# Patient Record
Sex: Male | Born: 1975 | Race: White | Hispanic: No | Marital: Single | Smoking: Current some day smoker
Health system: Southern US, Community
[De-identification: ages and names within clinical notes are randomized; demographics above are authoritative.]

---

## 2005-07-20 ENCOUNTER — Emergency Department: Payer: Self-pay | Admitting: Emergency Medicine

## 2007-05-06 ENCOUNTER — Ambulatory Visit: Payer: Self-pay

## 2008-01-30 IMAGING — CR RIGHT RING FINGER 2+V
1 series · 3 of 3 positions shown · non-contrast
Comparison: none

REASON FOR EXAM: swelling, injured
COMMENTS:

PROCEDURE:     DXR - DXR FINGER RING 4TH DIGIT RT CEEJAY  - May 06, 2007 [DATE]
RESULT:     Three views of the LEFT fourth finger show no fracture,
dislocation or other acute bony abnormality.

[Series 1: view not recorded · 0.17mm/px · 3 of 3 slices shown]
[im 1/3]
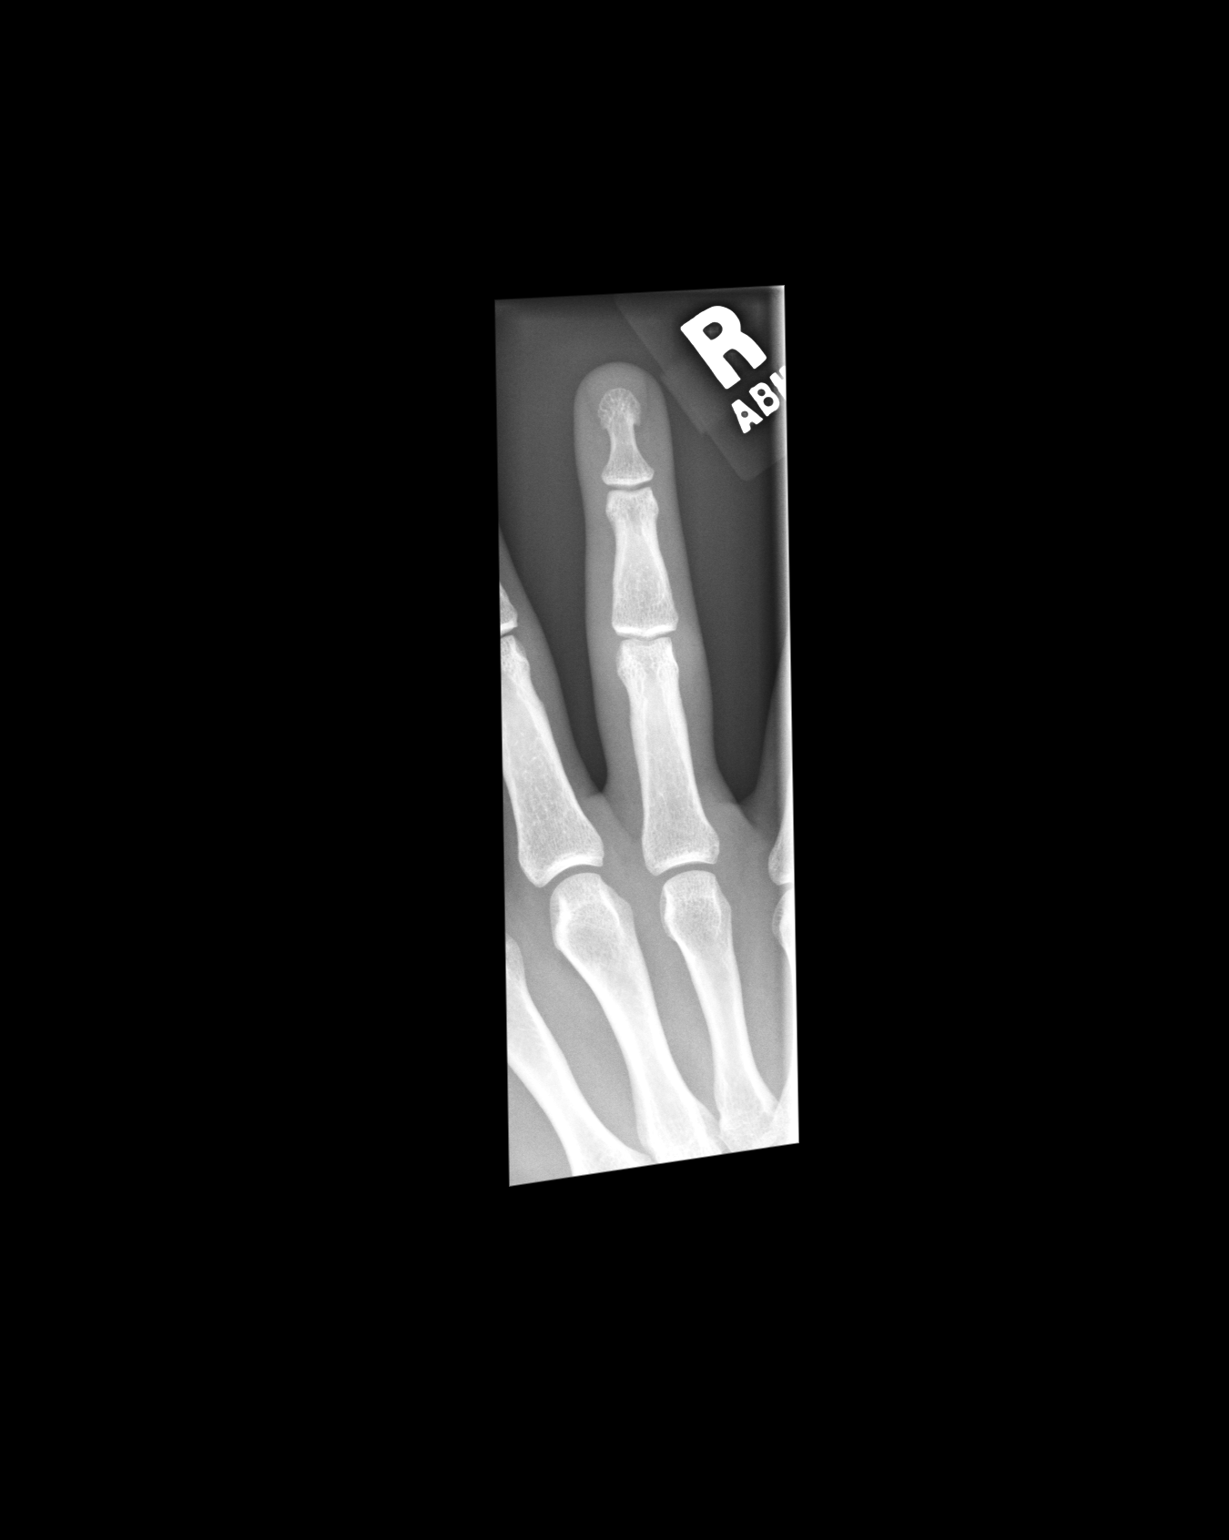
[im 2/3]
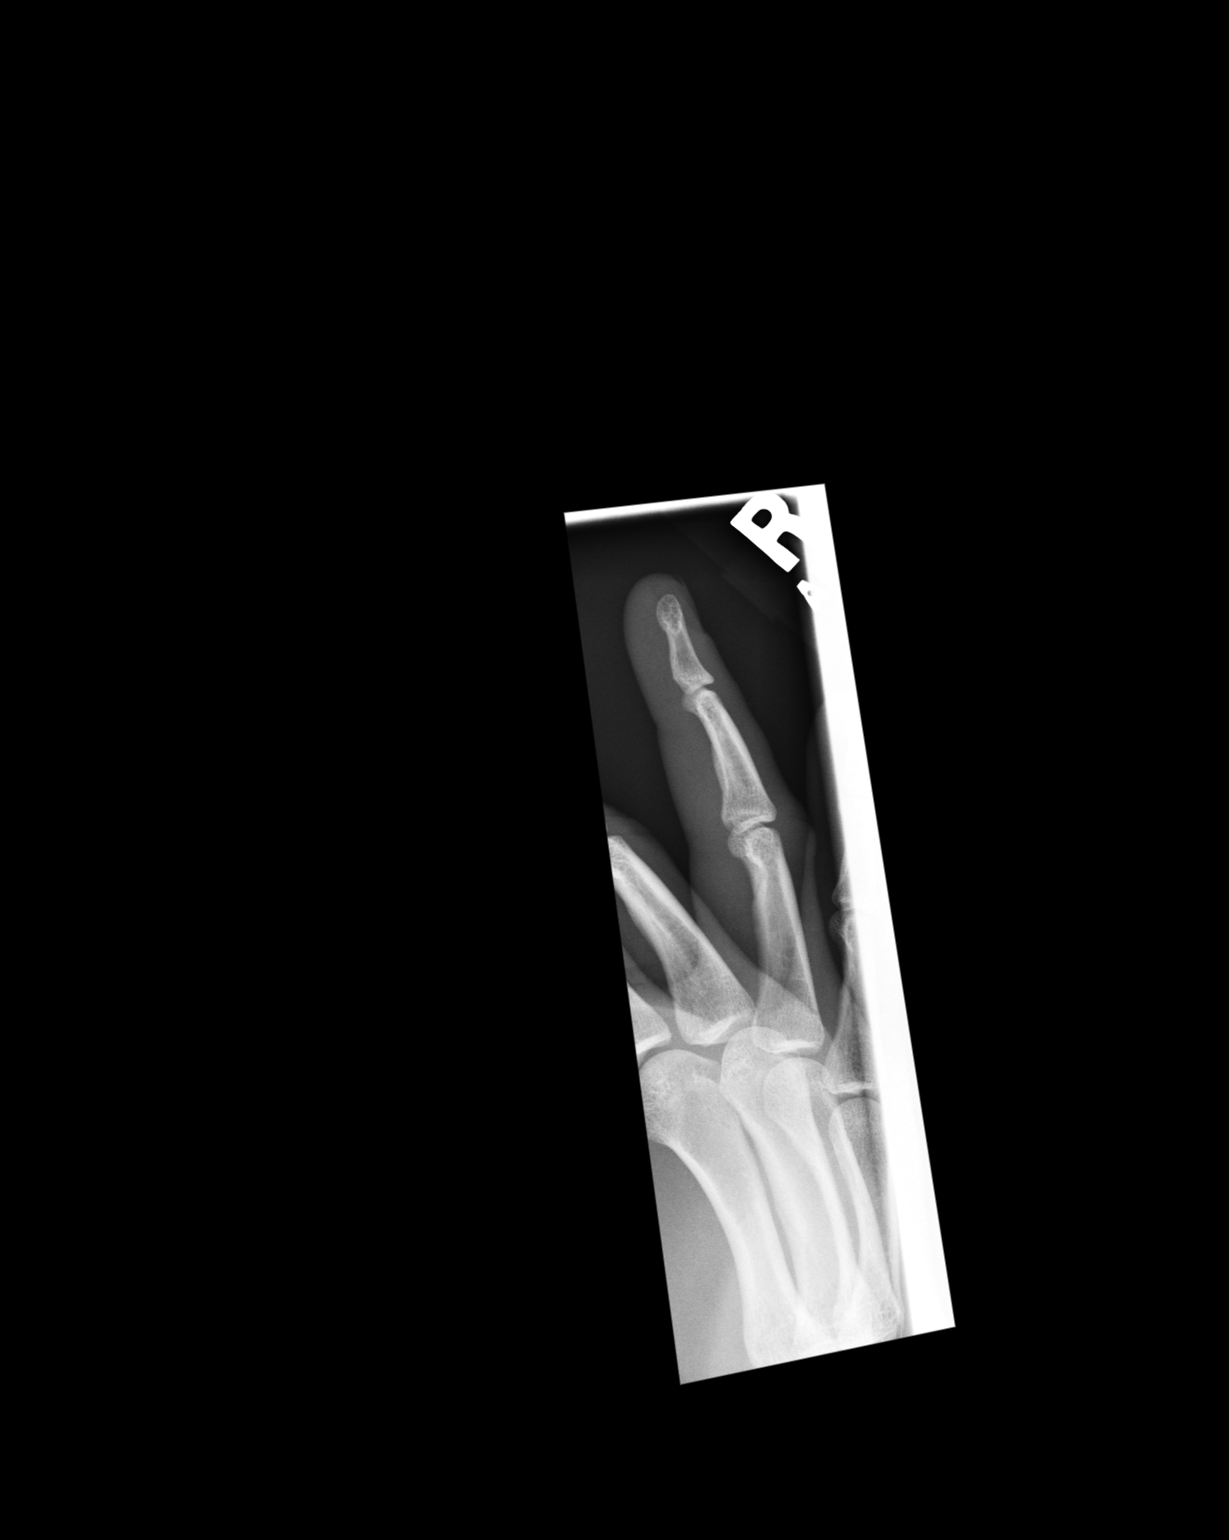
[im 3/3]
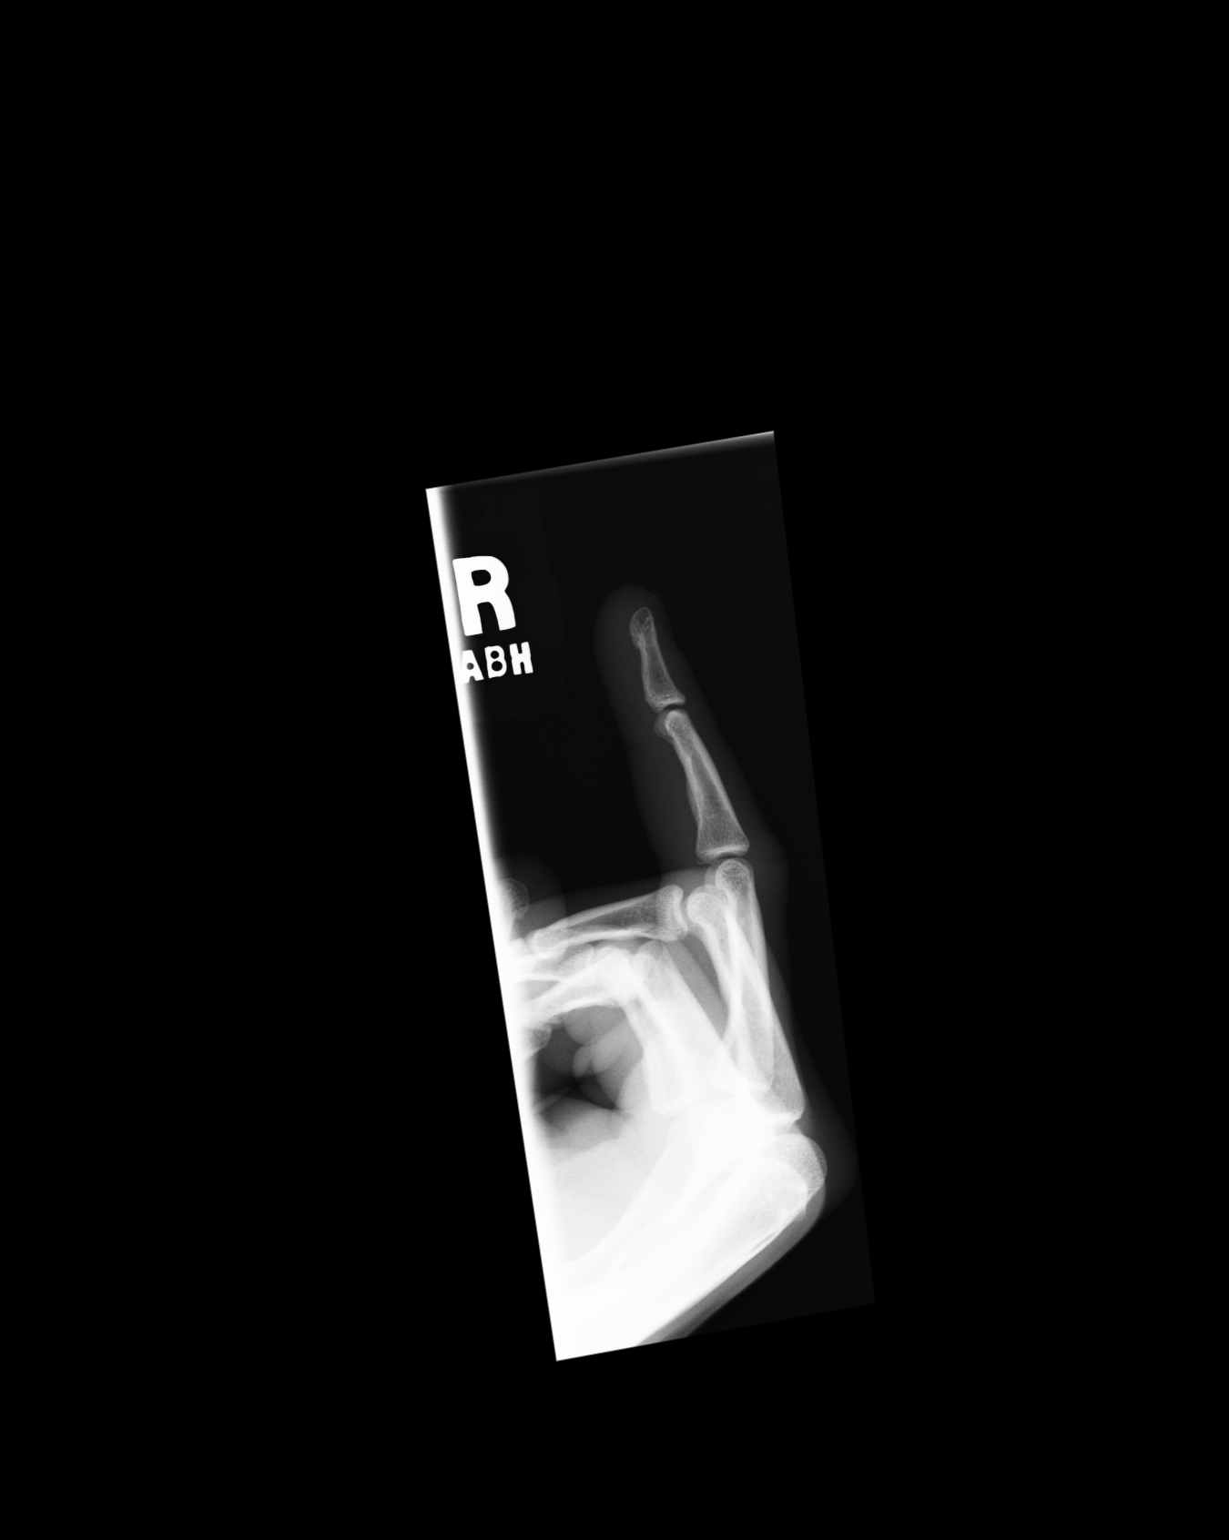

[3 of 3 positions shown; findings below may reference images not displayed]

IMPRESSION: 1.     No acute changes are identified.

## 2010-05-29 ENCOUNTER — Emergency Department: Payer: Self-pay | Admitting: Emergency Medicine

## 2012-09-17 ENCOUNTER — Emergency Department: Payer: Self-pay

## 2013-07-30 ENCOUNTER — Emergency Department: Payer: Self-pay | Admitting: Emergency Medicine

## 2013-11-17 ENCOUNTER — Emergency Department: Payer: Self-pay | Admitting: Emergency Medicine

## 2014-04-01 ENCOUNTER — Emergency Department: Payer: Self-pay | Admitting: Emergency Medicine

## 2016-07-16 ENCOUNTER — Emergency Department: Payer: Self-pay

## 2016-07-16 ENCOUNTER — Emergency Department
Admission: EM | Admit: 2016-07-16 | Discharge: 2016-07-16 | Disposition: A | Discharge status: Home / Self Care | Payer: Self-pay | Attending: Emergency Medicine | Admitting: Emergency Medicine

## 2016-07-16 ENCOUNTER — Encounter: Payer: Self-pay | Admitting: Emergency Medicine

## 2016-07-16 DIAGNOSIS — R0789 Other chest pain: Secondary | ICD-10-CM | POA: Insufficient documentation

## 2016-07-16 DIAGNOSIS — F17200 Nicotine dependence, unspecified, uncomplicated: Secondary | ICD-10-CM | POA: Insufficient documentation

## 2016-07-16 MED ORDER — CYCLOBENZAPRINE HCL 5 MG PO TABS: 5.0000 mg | ORAL_TABLET | Freq: Three times a day (TID) | ORAL | 0 refills | Status: DC | PRN

## 2016-07-16 NOTE — ED Triage Notes (Signed)
Pt presents with right shoulder/muscle pain.

## 2016-07-16 NOTE — Discharge Instructions (Signed)
Please seek medical attention for any high fevers, chest pain, shortness of breath, change in behavior, persistent vomiting, bloody stool or any other new or concerning symptoms.  

## 2016-07-16 NOTE — ED Notes (Signed)
Pt reports that he had a stomach virus yesterday and today he feels like he pulled something in his chest from dry heaving yesterday - he denies any nausea or vomiting today - coughing makes the pain worse

## 2016-07-16 NOTE — ED Provider Notes (Signed)
[facility] Emergency Department Provider Note    ____________________________________________   I have reviewed the triage vital signs and the nursing notes.   HISTORY  Chief Complaint Muscle Pain   History limited by: Not Limited   HPI Phillip Fisher is a 40 y.o. male who presents to the emergency department today because of concerns for right upper chest pain. He states that it started yesterday after he had been vomiting. He thinks he got a stomach bug. Thus he vomited and dry heaved a lot yesterday. The pain is sharp. It is worse with cough or movement. He denies any fevers although thinks he had some chills yesterday. He denies any further vomiting today.    History reviewed. No pertinent past medical history.  There are no active problems to display for this patient.   History reviewed. No pertinent surgical history.  Prior to Admission medications   Not on File    Allergies Review of patient's allergies indicates no known allergies.  No family history on file.  Social History Social History  Substance Use Topics  . Smoking status: Current Some Day Smoker  . Smokeless tobacco: Never Used  . Alcohol use Yes    Review of Systems  Constitutional: Negative for fever. Cardiovascular: Positive for right upper chest. Respiratory: Negative for shortness of breath. Gastrointestinal: Negative for abdominal pain, vomiting and diarrhea. Neurological: Negative for headaches, focal weakness or numbness.   10-point ROS otherwise negative.  ____________________________________________   PHYSICAL EXAM:  VITAL SIGNS: ED Triage Vitals  Enc Vitals Group     BP 07/16/16 1553 (!) 133/93     Pulse Rate 07/16/16 1553 (!) 109     Resp 07/16/16 1553 20     Temp 07/16/16 1553 98.4 F (36.9 C)     Temp Source 07/16/16 1553 Oral     SpO2 07/16/16 1553 98 %     Weight 07/16/16 1554 165 lb (74.8 kg)     Height 07/16/16 1554 5\' 8"  (1.727 m)      Head Circumference --      Peak Flow --      Pain Score 07/16/16 1554 9   Constitutional: Alert and oriented. Well appearing and in no distress. Eyes: Conjunctivae are normal. PERRL. Normal extraocular movements. ENT   Head: Normocephalic and atraumatic.   Nose: No congestion/rhinnorhea.   Mouth/Throat: Mucous membranes are moist.   Neck: No stridor. Hematological/Lymphatic/Immunilogical: No cervical lymphadenopathy. Cardiovascular: Normal rate, regular rhythm.  No murmurs, rubs, or gallops. Respiratory: Normal respiratory effort without tachypnea nor retractions. Breath sounds are clear and equal bilaterally. No wheezes/rales/rhonchi. Gastrointestinal: Soft and nontender. No distention.  Genitourinary: Deferred Musculoskeletal: Normal range of motion in all extremities. No joint effusions.  No lower extremity tenderness nor edema.Patient's right chest is tender to palpation Neurologic:  Normal speech and language. No gross focal neurologic deficits are appreciated.  Skin:  Skin is warm, dry and intact. No rash noted. Psychiatric: Mood and affect are normal. Speech and behavior are normal. Patient exhibits appropriate insight and judgment.  ____________________________________________    LABS (pertinent positives/negatives)  None  ____________________________________________   EKG  None  ____________________________________________    RADIOLOGY  CXR IMPRESSION:  No edema or consolidation. Scarring right apex.   ____________________________________________   PROCEDURES  Procedures  ____________________________________________   INITIAL IMPRESSION / ASSESSMENT AND PLAN / ED COURSE  Pertinent labs & imaging results that were available during my care of the patient were reviewed by me and considered in my  medical decision making (see chart for details).  Patient presented to the emergency department today because of concerns for right upper  chest pain. On exam patient is tender to palpation in the right upper chest. Chest x-ray without any concerning findings. Think likely patient is suffering from inflammation of the chest wall.  ____________________________________________   FINAL CLINICAL IMPRESSION(S) / ED DIAGNOSES  Final diagnoses:  Right-sided chest wall pain     Note: This dictation was prepared with Dragon dictation. Any transcriptional errors that result from this process are unintentional    Phineas SemenGraydon Kellis Mcadam, MD 07/16/16 1657

## 2017-04-11 IMAGING — CR DG CHEST 2V
2 series · 2 of 2 positions shown · non-contrast
Comparison: None.

CLINICAL DATA: Chest/right shoulder region pain

EXAM:
CHEST  2 VIEW

[chest pa]
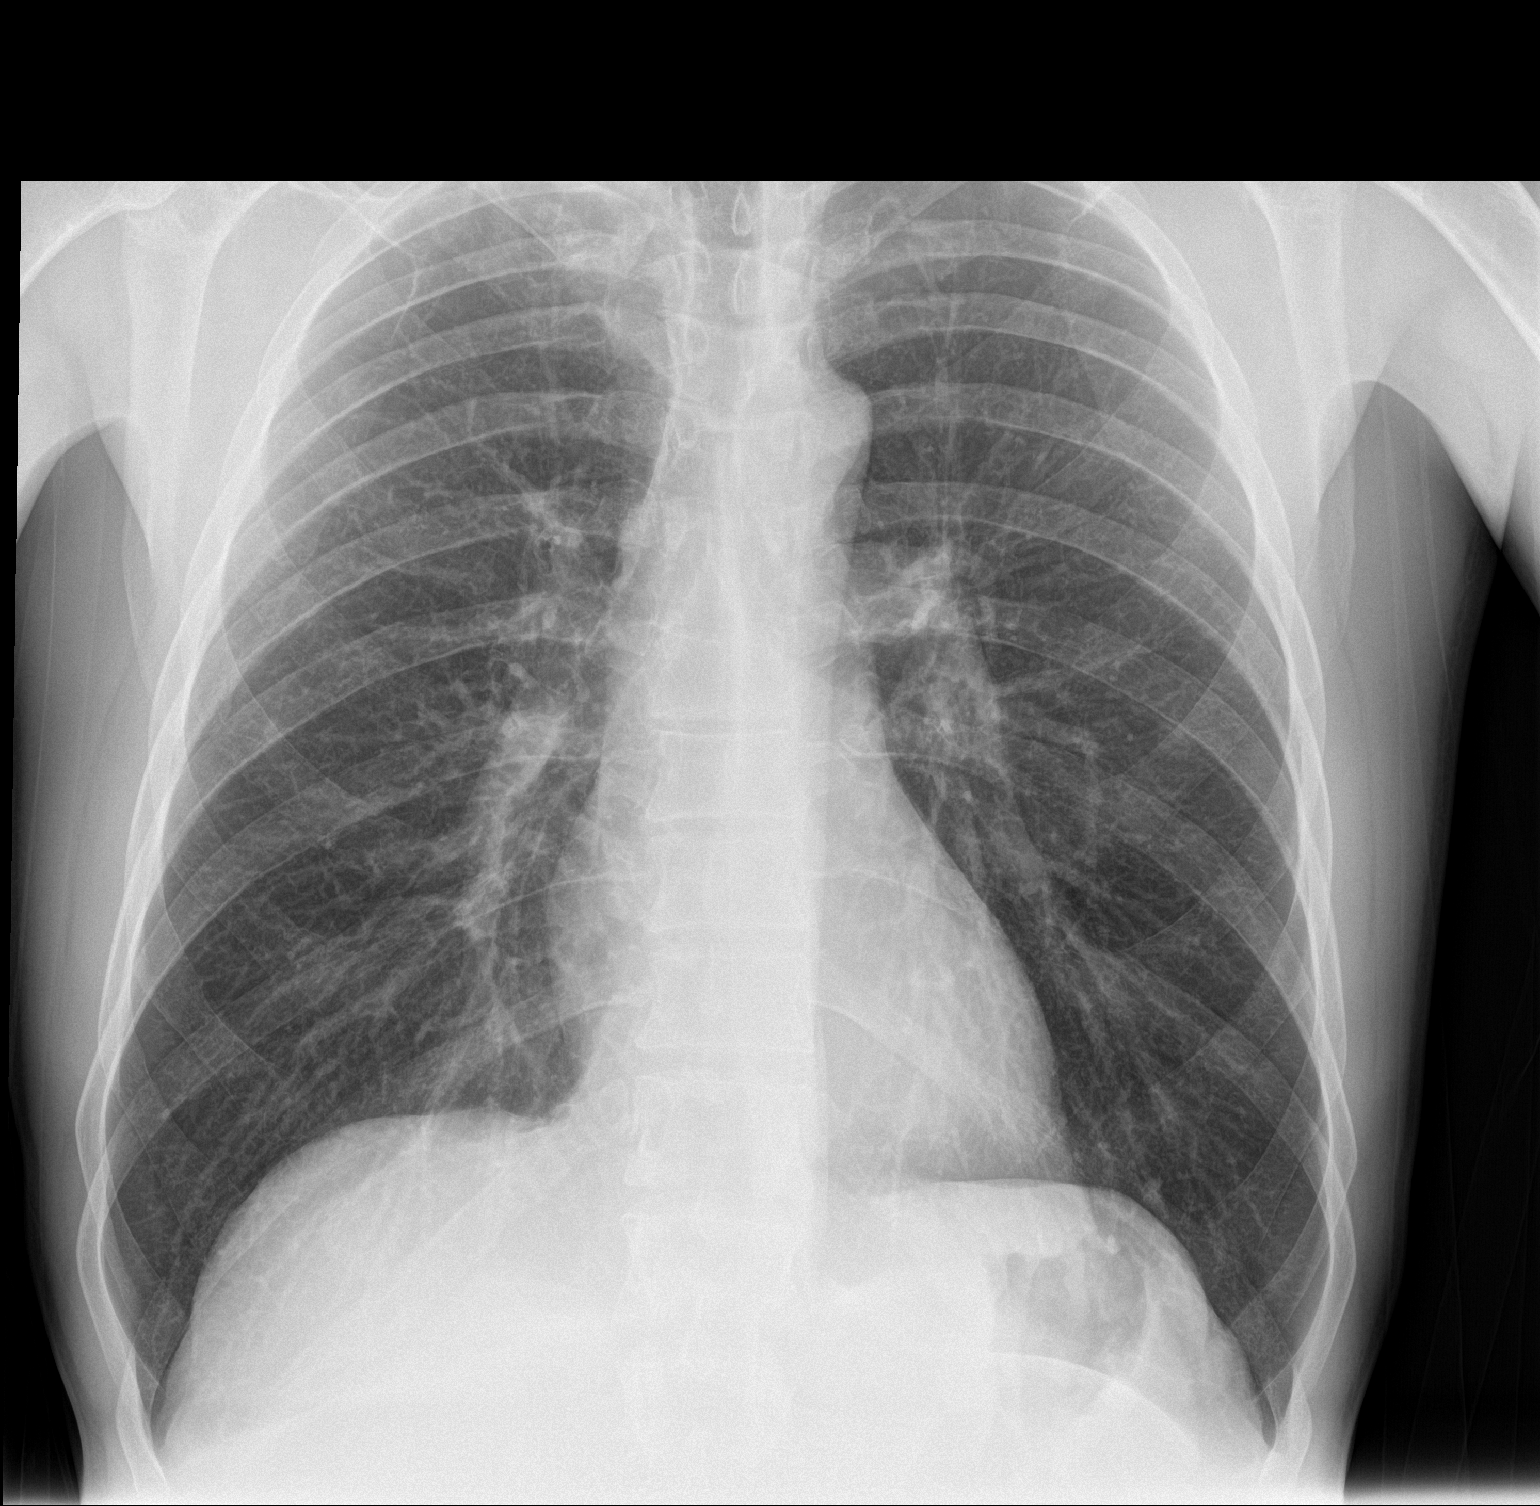

[chest lat]
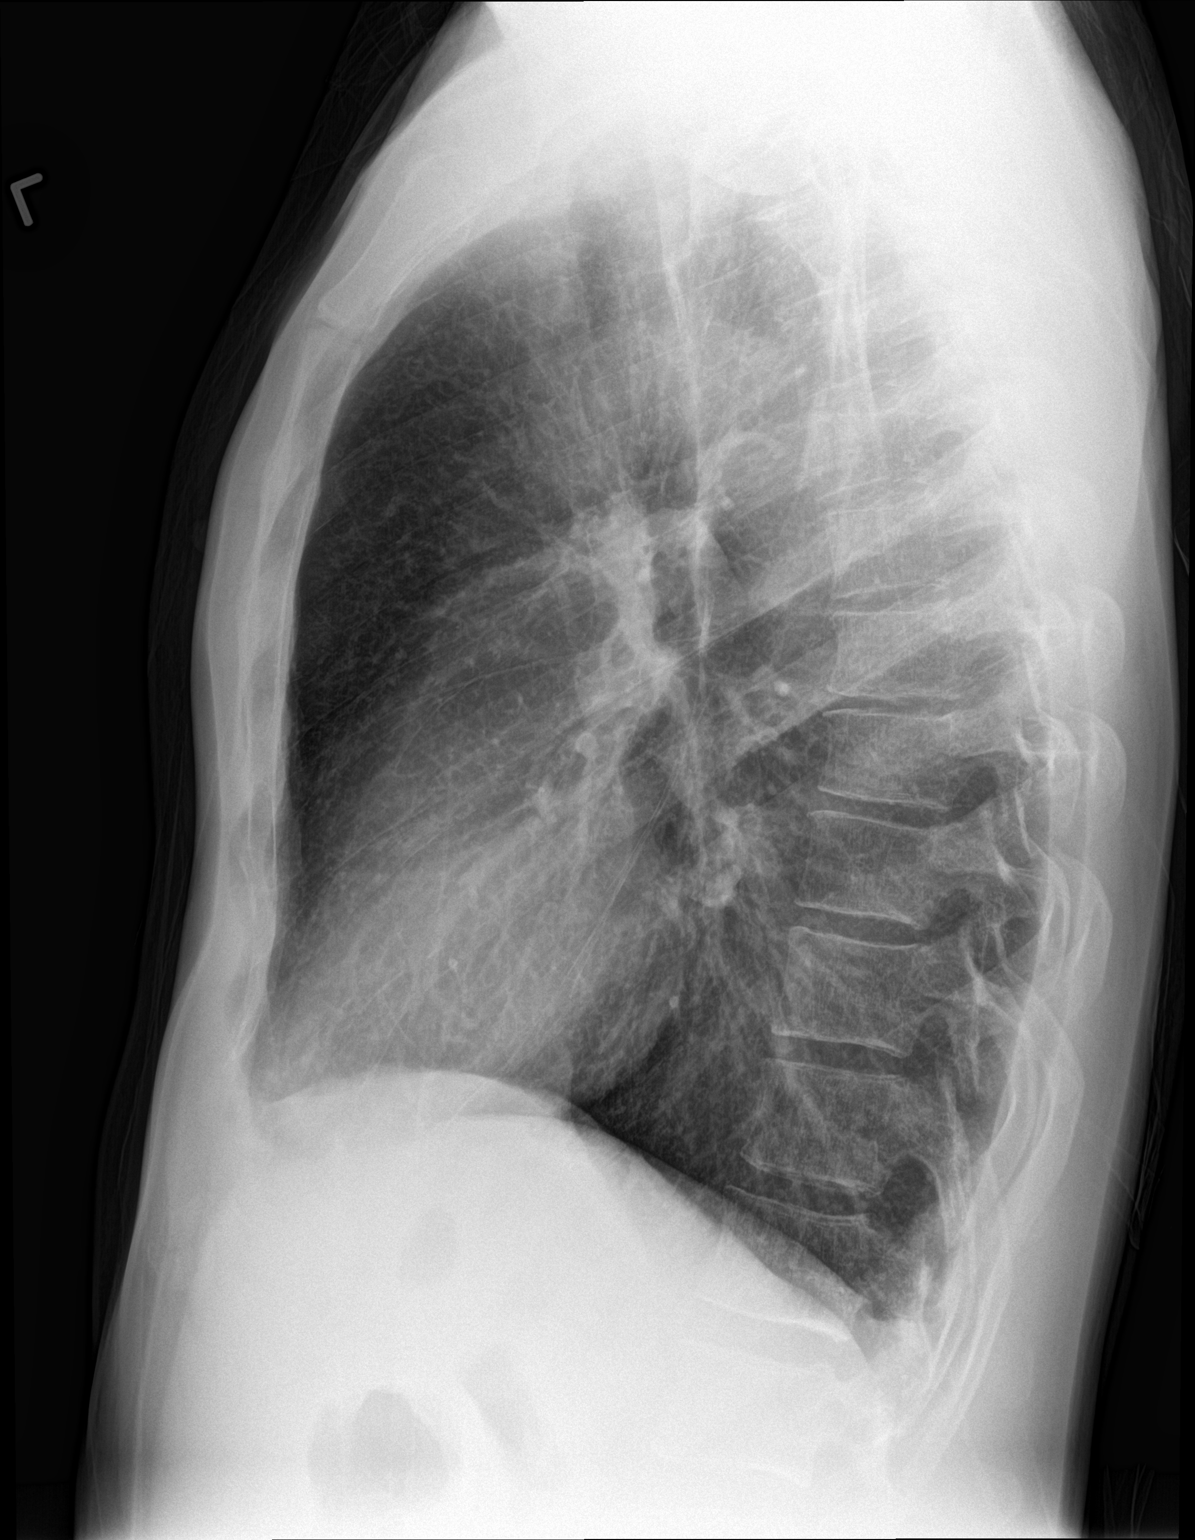

[2 of 2 positions shown; findings below may reference images not displayed]

FINDINGS: There is mild scarring in the right apex region. Lungs elsewhere
clear peer. Heart size and pulmonary vascularity are normal. No
adenopathy. No pneumothorax. No bone lesions.
IMPRESSION: No edema or consolidation.  Scarring right apex.

## 2020-07-27 ENCOUNTER — Emergency Department
Admission: EM | Admit: 2020-07-27 | Discharge: 2020-07-27 | Disposition: A | Discharge status: Home / Self Care | Payer: Self-pay | Attending: Emergency Medicine | Admitting: Emergency Medicine

## 2020-07-27 ENCOUNTER — Other Ambulatory Visit: Payer: Self-pay

## 2020-07-27 DIAGNOSIS — Z5321 Procedure and treatment not carried out due to patient leaving prior to being seen by health care provider: Secondary | ICD-10-CM | POA: Insufficient documentation

## 2020-07-27 DIAGNOSIS — R21 Rash and other nonspecific skin eruption: Secondary | ICD-10-CM | POA: Insufficient documentation

## 2020-07-27 NOTE — ED Triage Notes (Signed)
Pt comes EMS from home with rash on arms and chest and back for about a week. Has tried peroxide. Pt states anxiety from worrying about rash. Denies changes in laundry, skin care, meds, etc. Raised, red patches of rash all over upper abdomen.

## 2020-07-28 ENCOUNTER — Other Ambulatory Visit: Payer: Self-pay

## 2020-07-28 ENCOUNTER — Emergency Department
Admission: EM | Admit: 2020-07-28 | Discharge: 2020-07-28 | Disposition: A | Discharge status: Home / Self Care | Payer: Self-pay | Attending: Emergency Medicine | Admitting: Emergency Medicine

## 2020-07-28 DIAGNOSIS — R21 Rash and other nonspecific skin eruption: Secondary | ICD-10-CM

## 2020-07-28 DIAGNOSIS — L42 Pityriasis rosea: Secondary | ICD-10-CM | POA: Insufficient documentation

## 2020-07-28 DIAGNOSIS — F17200 Nicotine dependence, unspecified, uncomplicated: Secondary | ICD-10-CM | POA: Insufficient documentation

## 2020-07-28 DIAGNOSIS — Z79899 Other long term (current) drug therapy: Secondary | ICD-10-CM | POA: Insufficient documentation

## 2020-07-28 LAB — CBC WITH DIFFERENTIAL/PLATELET
Abs Immature Granulocytes: 0.04 10*3/uL (ref 0.00–0.07)
Basophils Absolute: 0.1 10*3/uL (ref 0.0–0.1)
Basophils Relative: 1 %
Eosinophils Absolute: 0.2 10*3/uL (ref 0.0–0.5)
Eosinophils Relative: 3 %
HCT: 40.2 % (ref 39.0–52.0)
Hemoglobin: 13.7 g/dL (ref 13.0–17.0)
Immature Granulocytes: 0 %
Lymphocytes Relative: 28 %
Lymphs Abs: 2.6 10*3/uL (ref 0.7–4.0)
MCH: 34.6 pg — ABNORMAL HIGH (ref 26.0–34.0)
MCHC: 34.1 g/dL (ref 30.0–36.0)
MCV: 101.5 fL — ABNORMAL HIGH (ref 80.0–100.0)
Monocytes Absolute: 0.5 10*3/uL (ref 0.1–1.0)
Monocytes Relative: 6 %
Neutro Abs: 5.7 10*3/uL (ref 1.7–7.7)
Neutrophils Relative %: 62 %
Platelets: 232 10*3/uL (ref 150–400)
RBC: 3.96 MIL/uL — ABNORMAL LOW (ref 4.22–5.81)
RDW: 13.2 % (ref 11.5–15.5)
WBC: 9.2 10*3/uL (ref 4.0–10.5)
nRBC: 0 % (ref 0.0–0.2)

## 2020-07-28 LAB — BASIC METABOLIC PANEL
Anion gap: 8 (ref 5–15)
BUN: 12 mg/dL (ref 6–20)
CO2: 24 mmol/L (ref 22–32)
Calcium: 8.1 mg/dL — ABNORMAL LOW (ref 8.9–10.3)
Chloride: 109 mmol/L (ref 98–111)
Creatinine, Ser: 0.8 mg/dL (ref 0.61–1.24)
GFR calc Af Amer: >60 mL/min (ref >60)
GFR calc non Af Amer: >60 mL/min (ref >60)
Glucose, Bld: 89 mg/dL (ref 70–99)
Potassium: 4.1 mmol/L (ref 3.5–5.1)
Sodium: 141 mmol/L (ref 135–145)

## 2020-07-28 LAB — C-REACTIVE PROTEIN: CRP: 0.7 mg/dL (ref <1.0)

## 2020-07-28 MED ORDER — METHYLPREDNISOLONE SODIUM SUCC 125 MG IJ SOLR
125.0000 mg | Freq: Once | INTRAMUSCULAR | Status: AC
  Administered 2020-07-28: 125 mg via INTRAVENOUS
  Filled 2020-07-28: qty 2

## 2020-07-28 MED ORDER — PREDNISONE 20 MG PO TABS: 40.0000 mg | ORAL_TABLET | Freq: Every day | ORAL | 0 refills | Status: AC

## 2020-07-28 MED ORDER — HYDROXYZINE PAMOATE 25 MG PO CAPS: 25.0000 mg | ORAL_CAPSULE | Freq: Three times a day (TID) | ORAL | 0 refills | Status: DC | PRN

## 2020-07-28 MED ORDER — FAMOTIDINE IN NACL 20-0.9 MG/50ML-% IV SOLN
20.0000 mg | Freq: Once | INTRAVENOUS | Status: AC
  Administered 2020-07-28: 20 mg via INTRAVENOUS
  Filled 2020-07-28: qty 50

## 2020-07-28 MED ORDER — FAMOTIDINE 20 MG PO TABS: 20.0000 mg | ORAL_TABLET | Freq: Two times a day (BID) | ORAL | 0 refills | Status: AC

## 2020-07-28 MED ORDER — SODIUM CHLORIDE 0.9 % IV BOLUS
500.0000 mL | Freq: Once | INTRAVENOUS | Status: AC
  Administered 2020-07-28: 500 mL via INTRAVENOUS

## 2020-07-28 MED ORDER — DIPHENHYDRAMINE HCL 50 MG/ML IJ SOLN
50.0000 mg | Freq: Once | INTRAMUSCULAR | Status: AC
  Administered 2020-07-28: 50 mg via INTRAVENOUS
  Filled 2020-07-28: qty 1

## 2020-07-28 NOTE — Discharge Instructions (Addendum)
Your exam & rash is most consistent with a common rash of unknown cause. You will be treated with steroids and anti-itch medicine. The rash will resolve on it own, there is no specific medicine to treat the rash. You may apply OTC petroleum jelly to keep the skin moist and supple. Stay cool and hydrated. Follow-up with [facility], [facility], or return if needed.

## 2020-07-28 NOTE — ED Provider Notes (Signed)
[facility] Emergency Department Provider Note ____________________________________________  Time seen: 1233  I have reviewed the triage vital signs and the nursing notes.  HISTORY  Chief Complaint  Rash  HPI Phillip Fisher is a 44 y.o. male presents himself to the ED for evaluation of a global rash to the trunk torso and extremities.  Patient describes onset a few days ago.  He denies any known contact, exposure, or triggers.  He denies any allergies to food and or medicines.  He describes onset of itchy flat rash initially to the left axilla and trunk.  He was concern for possible shingles at the time.  He presents now with multiple lesions across the trunk and extremities.  He denies any interim fever, chills, or sweats per patient is been using peroxide to apply to the lesions across his forearm and the interim.  He denies any chest pain, shortness of breath, or syncope.  He also denies any involvement of the mouth, lips, throat, or tongue.   History reviewed. No pertinent past medical history.  There are no problems to display for this patient.  History reviewed. No pertinent surgical history.  Prior to Admission medications   Medication Sig Start Date End Date Taking? Authorizing Provider  cyclobenzaprine (FLEXERIL) 5 MG tablet Take 1 tablet (5 mg total) by mouth 3 (three) times daily as needed for muscle spasms. 07/16/16   Phineas Semen, MD  famotidine (PEPCID) 20 MG tablet Take 1 tablet (20 mg total) by mouth 2 (two) times daily for 10 days. 07/28/20 08/07/20  Dorsey Authement, Charlesetta Ivory, PA-C  hydrOXYzine (VISTARIL) 25 MG capsule Take 1 capsule (25 mg total) by mouth 3 (three) times daily as needed for itching. 07/28/20   Amberlie Gaillard, Charlesetta Ivory, PA-C  predniSONE (DELTASONE) 20 MG tablet Take 2 tablets (40 mg total) by mouth daily with breakfast for 5 days. 07/28/20 08/02/20  Deshonna Trnka, Charlesetta Ivory, PA-C    Allergies Patient has no known allergies.  History  reviewed. No pertinent family history.  Social History Social History   Tobacco Use  . Smoking status: Current Some Day Smoker  . Smokeless tobacco: Never Used  Substance Use Topics  . Alcohol use: Yes  . Drug use: Not Currently    Review of Systems  Constitutional: Negative for fever. Eyes: Negative for visual changes. ENT: Negative for sore throat. Cardiovascular: Negative for chest pain. Respiratory: Negative for shortness of breath. Gastrointestinal: Negative for abdominal pain, vomiting and diarrhea. Genitourinary: Negative for dysuria. Musculoskeletal: Negative for back pain. Skin: Positive for rash. Neurological: Negative for headaches, focal weakness or numbness. ____________________________________________  PHYSICAL EXAM:  VITAL SIGNS: ED Triage Vitals  Enc Vitals Group     BP 07/28/20 1159 116/76     Pulse Rate 07/28/20 1157 63     Resp 07/28/20 1157 17     Temp 07/28/20 1157 97.6 F (36.4 C)     Temp Source 07/28/20 1157 Oral     SpO2 07/28/20 1157 99 %     Weight 07/28/20 1154 165 lb (74.8 kg)     Height 07/28/20 1154 5\' 7"  (1.702 m)     Head Circumference --      Peak Flow --      Pain Score 07/28/20 1154 10     Pain Loc --      Pain Edu? --      Excl. in GC? --     Constitutional: Alert and oriented. Well appearing and in no distress.  Head: Normocephalic and atraumatic. Eyes: Conjunctivae are normal. PERRL. Normal extraocular movements Cardiovascular: Normal rate, regular rhythm. Normal distal pulses. Respiratory: Normal respiratory effort. No wheezes/rales/rhonchi. Gastrointestinal: Soft and nontender. No distention. Musculoskeletal: Nontender with normal range of motion in all extremities.  Neurologic:  Normal gait without ataxia. Normal speech and language. No gross focal neurologic deficits are appreciated. Skin:  Skin is warm, dry and intact. No rash noted. Psychiatric: Mood and affect are normal. Patient exhibits appropriate insight and  judgment. ____________________________________________   LABS (pertinent positives/negatives)  Labs Reviewed  CBC WITH DIFFERENTIAL/PLATELET - Abnormal; Notable for the following components:      Result Value   RBC 3.96 (*)    MCV 101.5 (*)    MCH 34.6 (*)    All other components within normal limits  BASIC METABOLIC PANEL - Abnormal; Notable for the following components:   Calcium 8.1 (*)    All other components within normal limits  C-REACTIVE PROTEIN  ____________________________________________  PROCEDURES  Diphenhydramine 50 mg IVP Famotidine 20 mg IVPB Solumedrol 125 mg IVP NS 500 bolus  Procedures ____________________________________________  INITIAL IMPRESSION / ASSESSMENT AND PLAN / ED COURSE  DDX: contact dermatitis, urticaria, dermatophyte dermatitis, PR  Patient with ED evaluation of a pruritic rash that developed over his trunk and extremities over the last week. Patient is afebrile with otherwise normal labs at this time. Clinical presentation is likely consistent with a pityriasis rosea or some other nonspecific confirmatory rash at this time. Patient is advised to take the prescribed prednisone, hydroxyzine, and famotidine as directed. He is referred to dermatology for ongoing symptom management. He is advised to return to the ED for acutely worsening symptoms.  Phillip Fisher was evaluated in Emergency Department on 07/28/2020 for the symptoms described in the history of present illness. He was evaluated in the context of the global COVID-19 pandemic, which necessitated consideration that the patient might be at risk for infection with the SARS-CoV-2 virus that causes COVID-19. Institutional protocols and algorithms that pertain to the evaluation of patients at risk for COVID-19 are in a state of rapid change based on information released by regulatory bodies including the CDC and federal and state organizations. These policies and algorithms were followed during  the patient's care in the ED. ____________________________________________  FINAL CLINICAL IMPRESSION(S) / ED DIAGNOSES  Final diagnoses:  Rash and nonspecific skin eruption  PR (pityriasis rosea)      Karmen Stabs, Charlesetta Ivory, PA-C 07/28/20 1748    Delton Prairie, MD 07/29/20 [id number]

## 2020-07-28 NOTE — ED Triage Notes (Signed)
Arrives via EMS.  C/O shingles x 1 week and difficulty breathing.  Checked into ED last night for same, LWBS.  VS wnl.  NAD

## 2020-07-28 NOTE — ED Triage Notes (Addendum)
Pt c/o itchy rash all over for the past week, thinks he has shingles but has not been seen by MD. Ems reported pt c/o SOB, pt denies any sob or cough

## 2021-07-29 ENCOUNTER — Emergency Department
Admission: EM | Admit: 2021-07-29 | Discharge: 2021-07-29 | Disposition: A | Discharge status: Home / Self Care | Payer: Self-pay | Attending: Student in an Organized Health Care Education/Training Program | Admitting: Student in an Organized Health Care Education/Training Program

## 2021-07-29 ENCOUNTER — Other Ambulatory Visit: Payer: Self-pay

## 2021-07-29 DIAGNOSIS — F17200 Nicotine dependence, unspecified, uncomplicated: Secondary | ICD-10-CM | POA: Insufficient documentation

## 2021-07-29 DIAGNOSIS — R509 Fever, unspecified: Secondary | ICD-10-CM | POA: Insufficient documentation

## 2021-07-29 DIAGNOSIS — R000 Tachycardia, unspecified: Secondary | ICD-10-CM | POA: Insufficient documentation

## 2021-07-29 DIAGNOSIS — R197 Diarrhea, unspecified: Secondary | ICD-10-CM | POA: Insufficient documentation

## 2021-07-29 DIAGNOSIS — J029 Acute pharyngitis, unspecified: Secondary | ICD-10-CM | POA: Insufficient documentation

## 2021-07-29 DIAGNOSIS — R059 Cough, unspecified: Secondary | ICD-10-CM | POA: Insufficient documentation

## 2021-07-29 DIAGNOSIS — R0981 Nasal congestion: Secondary | ICD-10-CM | POA: Insufficient documentation

## 2021-07-29 DIAGNOSIS — R112 Nausea with vomiting, unspecified: Secondary | ICD-10-CM | POA: Insufficient documentation

## 2021-07-29 DIAGNOSIS — Z20822 Contact with and (suspected) exposure to covid-19: Secondary | ICD-10-CM | POA: Insufficient documentation

## 2021-07-29 LAB — LIPASE, BLOOD: Lipase: 38 U/L (ref 11–51)

## 2021-07-29 LAB — CBC
HCT: 45.2 % (ref 39.0–52.0)
Hemoglobin: 15.8 g/dL (ref 13.0–17.0)
MCH: 35.6 pg — ABNORMAL HIGH (ref 26.0–34.0)
MCHC: 35 g/dL (ref 30.0–36.0)
MCV: 101.8 fL — ABNORMAL HIGH (ref 80.0–100.0)
Platelets: 302 10*3/uL (ref 150–400)
RBC: 4.44 MIL/uL (ref 4.22–5.81)
RDW: 12 % (ref 11.5–15.5)
WBC: 14.5 10*3/uL — ABNORMAL HIGH (ref 4.0–10.5)
nRBC: 0 % (ref 0.0–0.2)

## 2021-07-29 LAB — COMPREHENSIVE METABOLIC PANEL
ALT: 21 U/L (ref 0–44)
AST: 29 U/L (ref 15–41)
Albumin: 3.7 g/dL (ref 3.5–5.0)
Alkaline Phosphatase: 66 U/L (ref 38–126)
Anion gap: 8 (ref 5–15)
BUN: 6 mg/dL (ref 6–20)
CO2: 27 mmol/L (ref 22–32)
Calcium: 9.1 mg/dL (ref 8.9–10.3)
Chloride: 103 mmol/L (ref 98–111)
Creatinine, Ser: 0.91 mg/dL (ref 0.61–1.24)
GFR, Estimated: >60 mL/min (ref >60)
Glucose, Bld: 108 mg/dL — ABNORMAL HIGH (ref 70–99)
Potassium: 3.8 mmol/L (ref 3.5–5.1)
Sodium: 138 mmol/L (ref 135–145)
Total Bilirubin: 0.8 mg/dL (ref 0.3–1.2)
Total Protein: 6.7 g/dL (ref 6.5–8.1)

## 2021-07-29 LAB — RESP PANEL BY RT-PCR (FLU A&B, COVID) ARPGX2
Influenza A by PCR: NEGATIVE
Influenza B by PCR: NEGATIVE
SARS Coronavirus 2 by RT PCR: NEGATIVE

## 2021-07-29 MED ORDER — ONDANSETRON 4 MG PO TBDP: 4.0000 mg | ORAL_TABLET | Freq: Three times a day (TID) | ORAL | 0 refills | Status: AC | PRN

## 2021-07-29 MED ORDER — ONDANSETRON HCL 4 MG/2ML IJ SOLN: 4.0000 mg | Freq: Once | INTRAMUSCULAR | Status: DC

## 2021-07-29 MED ORDER — SODIUM CHLORIDE 0.9 % IV BOLUS: 1000.0000 mL | Freq: Once | INTRAVENOUS | Status: DC

## 2021-07-29 NOTE — Discharge Instructions (Addendum)
You can take Zofran up to every eight hours as needed for nausea.  

## 2021-07-29 NOTE — ED Triage Notes (Signed)
Pt states 3-4 days ago he started having sore throat and vomiting- pt states that he has been having a hard time keeping anything down- pt states he had lost his voice d/t the sore throat, but that it is better now- pt denies abd pain

## 2021-07-29 NOTE — ED Provider Notes (Signed)
[facility]-EMERGENCY DEPARTMENT  ____________________________________________  Time seen: Approximately 5:00 PM  I have reviewed the triage vital signs and the nursing notes.   HISTORY  Chief Complaint Emesis   Historian Patient     HPI Phillip Fisher is a 45 y.o. male presents to the emergency department with intermittent pharyngitis, nonproductive cough, diarrhea and nausea that has occurred for the past 3 to 4 days.  Patient was recently around a friend symptoms.  No chest pain, chest tightness or abdominal pain.  Patient reports that he has had low-grade fever at home.  Patient has been able to speak in complete sentences and manage his own secretions.   History reviewed. No pertinent past medical history.   Immunizations up to date:  Yes.     History reviewed. No pertinent past medical history.  There are no problems to display for this patient.   History reviewed. No pertinent surgical history.  Prior to Admission medications   Medication Sig Start Date End Date Taking? Authorizing Provider  ondansetron (ZOFRAN ODT) 4 MG disintegrating tablet Take 1 tablet (4 mg total) by mouth every 8 (eight) hours as needed for up to 5 days. 07/29/21 08/03/21 Yes Pia Mau M, PA-C  cyclobenzaprine (FLEXERIL) 5 MG tablet Take 1 tablet (5 mg total) by mouth 3 (three) times daily as needed for muscle spasms. 07/16/16   Phineas Semen, MD  famotidine (PEPCID) 20 MG tablet Take 1 tablet (20 mg total) by mouth 2 (two) times daily for 10 days. 07/28/20 08/07/20  Menshew, Charlesetta Ivory, PA-C  hydrOXYzine (VISTARIL) 25 MG capsule Take 1 capsule (25 mg total) by mouth 3 (three) times daily as needed for itching. 07/28/20   Menshew, Charlesetta Ivory, PA-C    Allergies Patient has no known allergies.  No family history on file.  Social History Social History   Tobacco Use   Smoking status: Some Days   Smokeless tobacco: Never  Substance Use Topics   Alcohol use: Yes   Drug use: Not  Currently     Review of Systems  Constitutional: No fever/chills Eyes:  No discharge ENT: Patient has pharyngitis.  Respiratory: no cough. No SOB/ use of accessory muscles to breath Gastrointestinal: Patient has nausea and vomiting.  Musculoskeletal: Negative for musculoskeletal pain. Skin: Negative for rash, abrasions, lacerations, ecchymosis.   ____________________________________________   PHYSICAL EXAM:  VITAL SIGNS: ED Triage Vitals  Enc Vitals Group     BP 07/29/21 1502 (!) 139/95     Pulse Rate 07/29/21 1502 (!) 115     Resp 07/29/21 1502 18     Temp 07/29/21 1502 99.5 F (37.5 C)     Temp Source 07/29/21 1502 Oral     SpO2 07/29/21 1502 97 %     Weight 07/29/21 1504 155 lb (70.3 kg)     Height 07/29/21 1504 5\' 9"  (1.753 m)     Head Circumference --      Peak Flow --      Pain Score 07/29/21 1504 0     Pain Loc --      Pain Edu? --      Excl. in GC? --      Constitutional: Alert and oriented. Patient is lying supine. Eyes: Conjunctivae are normal. PERRL. EOMI. Head: Atraumatic. ENT:      Ears: Tympanic membranes are mildly injected with mild effusion bilaterally.       Nose: No congestion/rhinnorhea.      Mouth/Throat: Mucous membranes are moist. Posterior pharynx is  mildly erythematous.  Hematological/Lymphatic/Immunilogical: No cervical lymphadenopathy.  Cardiovascular: Normal rate, regular rhythm. Normal S1 and S2.  Good peripheral circulation. Respiratory: Normal respiratory effort without tachypnea or retractions. Lungs CTAB. Good air entry to the bases with no decreased or absent breath sounds. Gastrointestinal: Bowel sounds 4 quadrants. Soft and nontender to palpation. No guarding or rigidity. No palpable masses. No distention. No CVA tenderness. Musculoskeletal: Full range of motion to all extremities. No gross deformities appreciated. Neurologic:  Normal speech and language. No gross focal neurologic deficits are appreciated.  Skin:  Skin is warm,  dry and intact. No rash noted. Psychiatric: Mood and affect are normal. Speech and behavior are normal. Patient exhibits appropriate insight and judgement.   ____________________________________________   LABS (all labs ordered are listed, but only abnormal results are displayed)  Labs Reviewed  COMPREHENSIVE METABOLIC PANEL - Abnormal; Notable for the following components:      Result Value   Glucose, Bld 108 (*)    All other components within normal limits  CBC - Abnormal; Notable for the following components:   WBC 14.5 (*)    MCV 101.8 (*)    MCH 35.6 (*)    All other components within normal limits  RESP PANEL BY RT-PCR (FLU A&B, COVID) ARPGX2  LIPASE, BLOOD  URINALYSIS, COMPLETE (UACMP) WITH MICROSCOPIC   ____________________________________________  EKG   ____________________________________________  RADIOLOGY   No results found.  ____________________________________________    PROCEDURES  Procedure(s) performed:     Procedures     Medications - No data to display    ____________________________________________   INITIAL IMPRESSION / ASSESSMENT AND PLAN / ED COURSE  Pertinent labs & imaging results that were available during my care of the patient were reviewed by me and considered in my medical decision making (see chart for details).      Assessment and plan Nausea Vomiting 45 year old male presents to the emergency department with low-grade fever, intermittent pharyngitis, cough, nasal congestion, vomiting and diarrhea.  Patient was tachycardic at triage but vital signs otherwise reassuring.  On exam, patient was alert, active and nontoxic-appearing with no increased work of breathing or adventitious lung sounds.  Abdomen was soft and nontender without guarding.  Patient had elevated white blood cell count, 14.5 but CBC was otherwise reassuring.  CMP within reference range.  Lipase within reference range.    Patient stated that he was  able to drink fluids while waiting in the emergency department and would like to be discharged with antiemetics and a work note.  Patient was discharged with Zofran.  All patient questions were answered      ____________________________________________  FINAL CLINICAL IMPRESSION(S) / ED DIAGNOSES  Final diagnoses:  Non-intractable vomiting with nausea, unspecified vomiting type      NEW MEDICATIONS STARTED DURING THIS VISIT:  ED Discharge Orders          Ordered    ondansetron (ZOFRAN ODT) 4 MG disintegrating tablet  Every 8 hours PRN        07/29/21 1709                This chart was dictated using voice recognition software/Dragon. Despite best efforts to proofread, errors can occur which can change the meaning. Any change was purely unintentional.     Orvil Feil, PA-C 07/29/21 1859    Willy Eddy, MD 07/29/21 1904

## 2022-02-16 ENCOUNTER — Emergency Department
Admission: EM | Admit: 2022-02-16 | Discharge: 2022-02-16 | Disposition: A | Discharge status: Home / Self Care | Payer: Self-pay | Attending: Emergency Medicine | Admitting: Emergency Medicine

## 2022-02-16 ENCOUNTER — Other Ambulatory Visit: Payer: Self-pay

## 2022-02-16 DIAGNOSIS — F10920 Alcohol use, unspecified with intoxication, uncomplicated: Secondary | ICD-10-CM

## 2022-02-16 DIAGNOSIS — R531 Weakness: Secondary | ICD-10-CM | POA: Insufficient documentation

## 2022-02-16 DIAGNOSIS — F1090 Alcohol use, unspecified, uncomplicated: Secondary | ICD-10-CM | POA: Insufficient documentation

## 2022-02-16 DIAGNOSIS — Y906 Blood alcohol level of 120-199 mg/100 ml: Secondary | ICD-10-CM | POA: Insufficient documentation

## 2022-02-16 LAB — COMPREHENSIVE METABOLIC PANEL
ALT: 19 U/L (ref 0–44)
AST: 29 U/L (ref 15–41)
Albumin: 3.2 g/dL — ABNORMAL LOW (ref 3.5–5.0)
Alkaline Phosphatase: 54 U/L (ref 38–126)
Anion gap: 10 (ref 5–15)
BUN: 6 mg/dL (ref 6–20)
CO2: 21 mmol/L — ABNORMAL LOW (ref 22–32)
Calcium: 8 mg/dL — ABNORMAL LOW (ref 8.9–10.3)
Chloride: 106 mmol/L (ref 98–111)
Creatinine, Ser: 0.67 mg/dL (ref 0.61–1.24)
GFR, Estimated: >60 mL/min (ref >60)
Glucose, Bld: 107 mg/dL — ABNORMAL HIGH (ref 70–99)
Potassium: 3.4 mmol/L — ABNORMAL LOW (ref 3.5–5.1)
Sodium: 137 mmol/L (ref 135–145)
Total Bilirubin: 0.4 mg/dL (ref 0.3–1.2)
Total Protein: 5.8 g/dL — ABNORMAL LOW (ref 6.5–8.1)

## 2022-02-16 LAB — CBC
HCT: 39.9 % (ref 39.0–52.0)
Hemoglobin: 13.6 g/dL (ref 13.0–17.0)
MCH: 34.1 pg — ABNORMAL HIGH (ref 26.0–34.0)
MCHC: 34.1 g/dL (ref 30.0–36.0)
MCV: 100 fL (ref 80.0–100.0)
Platelets: 251 10*3/uL (ref 150–400)
RBC: 3.99 MIL/uL — ABNORMAL LOW (ref 4.22–5.81)
RDW: 12.3 % (ref 11.5–15.5)
WBC: 10 10*3/uL (ref 4.0–10.5)
nRBC: 0 % (ref 0.0–0.2)

## 2022-02-16 LAB — ETHANOL: Alcohol, Ethyl (B): 184 mg/dL — ABNORMAL HIGH (ref <10)

## 2022-02-16 MED ORDER — SODIUM CHLORIDE 0.9 % IV BOLUS
1000.0000 mL | Freq: Once | INTRAVENOUS | Status: AC
  Administered 2022-02-16: 1000 mL via INTRAVENOUS

## 2022-02-16 NOTE — ED Provider Notes (Signed)
? ?[facility] ?Provider Note ? ? ? Event Date/Time  ? First MD Initiated Contact with Patient 02/16/22 1028   ?  (approximate) ? ?History  ? ?Chief Complaint: Alcohol Intoxication ? ?HPI ? ?Phillip Fisher is a 46 y.o. male with a past medical history of alcohol abuse presents to the emergency department for weakness.  According to the patient he admits to alcohol use this morning as well as last night.  Patient states he was at the bus stop today when he began feeling very weak and just had to "lay down."  Patient denies passing out, denies hitting his head.  Patient did vomit with EMS, received 4 mg of IV Zofran and 500 cc of normal saline.  Upon arrival to the emergency department patient states he is feeling much better.  Denies any chest pain denies abdominal pain.  Does admit to alcohol use as well as marijuana. ? ?Physical Exam  ? ?Triage Vital Signs: ?ED Triage Vitals  ?Enc Vitals Group  ?   BP 02/16/22 1024 118/82  ?   Pulse Rate 02/16/22 1024 75  ?   Resp 02/16/22 1024 16  ?   Temp 02/16/22 1024 98.4 ?F (36.9 ?C)  ?   Temp Source 02/16/22 1024 Oral  ?   SpO2 02/16/22 1024 98 %  ?   Weight --   ?   Height --   ?   Head Circumference --   ?   Peak Flow --   ?   Pain Score 02/16/22 1030 0  ?   Pain Loc --   ?   Pain Edu? --   ?   Excl. in GC? --   ? ? ?Most recent vital signs: ?Vitals:  ? 02/16/22 1024  ?BP: 118/82  ?Pulse: 75  ?Resp: 16  ?Temp: 98.4 ?F (36.9 ?C)  ?SpO2: 98%  ? ? ?General: Awake, no distress.  ?CV:  Good peripheral perfusion.  Regular rate and rhythm  ?Resp:  Normal effort.  Equal breath sounds bilaterally.  ?Abd:  No distention.  Soft, nontender.  No rebound or guarding. ? ? ?ED Results / Procedures / Treatments  ? ?EKG ? ?EKG viewed and interpreted by myself shows a normal sinus rhythm at 74 bpm with a narrow QRS, normal axis, slight QTc prolongation otherwise normal intervals with no concerning ST changes. ? ?MEDICATIONS ORDERED IN ED: ?Medications  ?sodium  chloride 0.9 % bolus 1,000 mL (has no administration in time range)  ? ? ? ?IMPRESSION / MDM / ASSESSMENT AND PLAN / ED COURSE  ?I reviewed the triage vital signs and the nursing notes. ? ?Patient presents to the emergency department for alcohol use and weakness.  Overall the patient appears well.  No distress.  States he is feeling better after Zofran and fluids by EMS.  We will check labs, EKG, continue to IV hydrate and treat nausea with Zofran.  Patient denies any chest pain, shortness of breath, cough or congestion.  No fever or diarrhea. ? ?EKG is reassuring.  Lab work is pending.  Patient continues to have no complaints at this time besides weakness. ? ?Patient's lab work has resulted showing overall reassuring chemistry, normal CBC.  Patient's alcohol level is 184 consistent with alcohol intoxication.  Patient is currently resting in the emergency department.  Once the patient is clinically sober we will discharge with outpatient resources to be used for detox if needed.  Patient agreeable. ? ?FINAL CLINICAL IMPRESSION(S) / ED DIAGNOSES  ? ?  Alcohol use disorder ?Weakness ? ? ?Note:  This document was prepared using Dragon voice recognition software and may include unintentional dictation errors. ?  ?Minna Antis, MD ?02/16/22 1413 ? ?

## 2022-02-16 NOTE — ED Notes (Signed)
Collected red, blue, green, lavender ?

## 2022-02-16 NOTE — ED Triage Notes (Signed)
BIB [facility] from bus stop at [employer] [address]. Called out for syncope. EMS woke pt up, began vomiting. IV established. 4 mg of Zofran. 500 cc of NS. 159 BGL Normal Hr 114/72 ?No ECG done by EMS.  ?

## 2022-05-24 ENCOUNTER — Other Ambulatory Visit: Payer: Self-pay

## 2022-05-24 ENCOUNTER — Encounter: Payer: Self-pay | Admitting: Emergency Medicine

## 2022-05-24 ENCOUNTER — Emergency Department
Admission: EM | Admit: 2022-05-24 | Discharge: 2022-05-24 | Disposition: A | Discharge status: Home / Self Care | Payer: Self-pay | Attending: Emergency Medicine | Admitting: Emergency Medicine

## 2022-05-24 DIAGNOSIS — T310 Burns involving less than 10% of body surface: Secondary | ICD-10-CM | POA: Insufficient documentation

## 2022-05-24 DIAGNOSIS — Z23 Encounter for immunization: Secondary | ICD-10-CM | POA: Insufficient documentation

## 2022-05-24 DIAGNOSIS — T22211A Burn of second degree of right forearm, initial encounter: Secondary | ICD-10-CM | POA: Insufficient documentation

## 2022-05-24 DIAGNOSIS — X19XXXA Contact with other heat and hot substances, initial encounter: Secondary | ICD-10-CM | POA: Insufficient documentation

## 2022-05-24 DIAGNOSIS — Y990 Civilian activity done for income or pay: Secondary | ICD-10-CM | POA: Insufficient documentation

## 2022-05-24 MED ORDER — BACITRACIN ZINC 500 UNIT/GM EX OINT
TOPICAL_OINTMENT | Freq: Two times a day (BID) | CUTANEOUS | Status: DC
  Administered 2022-05-24: 1 via TOPICAL
  Filled 2022-05-24: qty 0.9

## 2022-05-24 MED ORDER — TETANUS-DIPHTH-ACELL PERTUSSIS 5-2.5-18.5 LF-MCG/0.5 IM SUSY
0.5000 mL | PREFILLED_SYRINGE | Freq: Once | INTRAMUSCULAR | Status: AC
  Administered 2022-05-24: 0.5 mL via INTRAMUSCULAR
  Filled 2022-05-24: qty 0.5
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
## 2022-06-23 ENCOUNTER — Other Ambulatory Visit: Payer: Self-pay

## 2022-06-23 ENCOUNTER — Emergency Department
Admission: EM | Admit: 2022-06-23 | Discharge: 2022-06-23 | Disposition: A | Discharge status: Home / Self Care | Payer: Self-pay | Attending: Emergency Medicine | Admitting: Emergency Medicine

## 2022-06-23 ENCOUNTER — Emergency Department: Payer: Self-pay

## 2022-06-23 DIAGNOSIS — Z20822 Contact with and (suspected) exposure to covid-19: Secondary | ICD-10-CM | POA: Insufficient documentation

## 2022-06-23 DIAGNOSIS — J069 Acute upper respiratory infection, unspecified: Secondary | ICD-10-CM | POA: Insufficient documentation

## 2022-06-23 DIAGNOSIS — B9789 Other viral agents as the cause of diseases classified elsewhere: Secondary | ICD-10-CM | POA: Insufficient documentation

## 2022-06-23 LAB — RESP PANEL BY RT-PCR (FLU A&B, COVID) ARPGX2
Influenza A by PCR: NEGATIVE
Influenza B by PCR: NEGATIVE
SARS Coronavirus 2 by RT PCR: NEGATIVE

## 2022-06-23 LAB — GROUP A STREP BY PCR: Group A Strep by PCR: NOT DETECTED

## 2022-06-23 MED ORDER — FLUTICASONE PROPIONATE 50 MCG/ACT NA SUSP: 1.0000 | Freq: Every day | NASAL | 2 refills | Status: AC

## 2022-06-23 MED ORDER — BENZONATATE 100 MG PO CAPS: 100.0000 mg | ORAL_CAPSULE | Freq: Three times a day (TID) | ORAL | 0 refills | Status: AC | PRN

## 2022-06-23 MED ORDER — ALBUTEROL SULFATE HFA 108 (90 BASE) MCG/ACT IN AERS: 2.0000 | INHALATION_SPRAY | Freq: Four times a day (QID) | RESPIRATORY_TRACT | 2 refills | Status: AC | PRN

## 2022-06-23 NOTE — Discharge Instructions (Addendum)
You can use 2 puffs of albuterol as needed for shortness of breath and wheezing You can take 2 Tessalon Perles at night before bed. You can pick up Mucinex DM at your pharmacy and you can combine Mucinex DM with 2 of the Tessalon Perles mentioned above. You can use 1 spray of Flonase each side for nasal congestion.

## 2022-06-23 NOTE — ED Provider Notes (Signed)
[facility] Provider Note  Patient Contact: 3:31 PM (approximate)   History   Shortness of Breath   HPI  Phillip Fisher is a 46 y.o. male with an unremarkable past medical history, presents to the emergency department with cough and some mild shortness of breath.  Patient reports that on Tuesday, he spent the night with a friend whose daughter is COVID-positive.  He states that he has had some body aches, chills, fever and some mild shortness of breath.  He states that he is requesting a work note.  No chest pain or abdominal pain.      Physical Exam   Triage Vital Signs: ED Triage Vitals  Enc Vitals Group     BP 06/23/22 1512 (!) 128/92     Pulse Rate 06/23/22 1512 100     Resp 06/23/22 1512 18     Temp 06/23/22 1516 98.2 F (36.8 C)     Temp Source 06/23/22 1516 Oral     SpO2 06/23/22 1512 99 %     Weight 06/23/22 1512 155 lb (70.3 kg)     Height 06/23/22 1512 5\' 8"  (1.727 m)     Head Circumference --      Peak Flow --      Pain Score --      Pain Loc --      Pain Edu? --      Excl. in GC? --     Most recent vital signs: Vitals:   06/23/22 1512 06/23/22 1516  BP: (!) 128/92   Pulse: 100   Resp: 18   Temp:  98.2 F (36.8 C)  SpO2: 99%      Constitutional: Alert and oriented. Patient is lying supine. Eyes: Conjunctivae are normal. PERRL. EOMI. Head: Atraumatic. ENT:      Ears: Tympanic membranes are mildly injected with mild effusion bilaterally.       Nose: No congestion/rhinnorhea.      Mouth/Throat: Mucous membranes are moist. Posterior pharynx is mildly erythematous.  Hematological/Lymphatic/Immunilogical: No cervical lymphadenopathy.  Cardiovascular: Normal rate, regular rhythm. Normal S1 and S2.  Good peripheral circulation. Respiratory: Normal respiratory effort without tachypnea or retractions. Lungs CTAB. Good air entry to the bases with no decreased or absent breath sounds. Gastrointestinal: Bowel sounds 4 quadrants.  Soft and nontender to palpation. No guarding or rigidity. No palpable masses. No distention. No CVA tenderness. Musculoskeletal: Full range of motion to all extremities. No gross deformities appreciated. Neurologic:  Normal speech and language. No gross focal neurologic deficits are appreciated.  Skin:  Skin is warm, dry and intact. No rash noted. Psychiatric: Mood and affect are normal. Speech and behavior are normal. Patient exhibits appropriate insight and judgement.    ED Results / Procedures / Treatments   Labs (all labs ordered are listed, but only abnormal results are displayed) Labs Reviewed  GROUP A STREP BY PCR  RESP PANEL BY RT-PCR (FLU A&B, COVID) [id number]        RADIOLOGY  I personally viewed and evaluated these images as part of my medical decision making, as well as reviewing the written report by the radiologist.  ED Provider Interpretation: No consolidations, opacities or infiltrates to suggest pneumonia.   PROCEDURES:  Critical Care performed: No  Procedures   MEDICATIONS ORDERED IN ED: Medications - No data to display   IMPRESSION / MDM / ASSESSMENT AND PLAN / ED COURSE  I reviewed the triage vital signs and the nursing notes.  Assessment and plan:  Fever:  46 year old male presents to the emergency department with viral URI-like symptoms.  Patient was mildly tachycardic at triage but vital signs were otherwise reassuring.  He was alert, active and nontoxic-appearing.  COVID-19 testing in process.  Chest x-ray reassuring.  We will treat with Tessalon Perles, Flonase and an albuterol inhaler.  A work note was provided as requested.   FINAL CLINICAL IMPRESSION(S) / ED DIAGNOSES   Final diagnoses:  Viral URI with cough     Rx / DC Orders   ED Discharge Orders          Ordered    benzonatate (TESSALON PERLES) 100 MG capsule  3 times daily PRN        06/23/22 1557    albuterol (VENTOLIN HFA) 108 (90 Base) MCG/ACT  inhaler  Every 6 hours PRN        06/23/22 1557    fluticasone (FLONASE) 50 MCG/ACT nasal spray  Daily        06/23/22 1558             Note:  This document was prepared using Dragon voice recognition software and may include unintentional dictation errors.   Pia Mau Zephyr, PA-C 06/23/22 1604    Concha Se, MD 06/23/22 [phone]

## 2022-06-23 NOTE — ED Triage Notes (Signed)
Ambulatory to triage with c/o shortness of breath. Reports recent contact with someone who may have had Covid.  N/V this morning with 3 episodes of vomiting. Also reports loss of smell last several days Pt speaking in clear and complete sentences without difficulty. No resp distress noted.

## 2022-06-23 NOTE — ED Notes (Signed)
Patient declined discharge vital signs. 

## 2022-11-01 ENCOUNTER — Other Ambulatory Visit: Payer: Self-pay

## 2022-11-01 ENCOUNTER — Emergency Department
Admission: EM | Admit: 2022-11-01 | Discharge: 2022-11-01 | Disposition: A | Discharge status: Home / Self Care | Payer: Self-pay | Attending: Emergency Medicine | Admitting: Emergency Medicine

## 2022-11-01 ENCOUNTER — Encounter: Payer: Self-pay | Admitting: Emergency Medicine

## 2022-11-01 ENCOUNTER — Emergency Department: Payer: Self-pay

## 2022-11-01 DIAGNOSIS — M5431 Sciatica, right side: Secondary | ICD-10-CM

## 2022-11-01 DIAGNOSIS — R209 Unspecified disturbances of skin sensation: Secondary | ICD-10-CM | POA: Insufficient documentation

## 2022-11-01 DIAGNOSIS — M79604 Pain in right leg: Secondary | ICD-10-CM | POA: Insufficient documentation

## 2022-11-01 DIAGNOSIS — M5432 Sciatica, left side: Secondary | ICD-10-CM | POA: Insufficient documentation

## 2022-11-01 MED ORDER — PREDNISONE 10 MG PO TABS: ORAL_TABLET | ORAL | 0 refills | Status: AC

## 2022-11-01 MED ORDER — KETOROLAC TROMETHAMINE 30 MG/ML IJ SOLN
30.0000 mg | Freq: Once | INTRAMUSCULAR | Status: AC
  Administered 2022-11-01: 30 mg via INTRAMUSCULAR
  Filled 2022-11-01: qty 1

## 2022-11-01 NOTE — Discharge Instructions (Addendum)
Call make an appoint with Dr. Martha Clan who is on-call for orthopedics.  His contact information and address are listed on your discharge papers.  Begin taking the prednisone as we discussed.  You may use ice or heat to your lower back as needed for discomfort.  Elevate your knees with 2 pillows if you sleep on your back at night.

## 2022-11-01 NOTE — ED Provider Notes (Signed)
[facility] Provider Note    Event Date/Time   First MD Initiated Contact with Patient 11/01/22 1359     (approximate)   History   Back Pain and Leg Pain   HPI  Phillip Fisher is a 46 y.o. male   presents to the ED with complaint of right leg numbness for approximately 1-1/2 months.  Patient denies any previous injury.  No over-the-counter medications has been taken.  He reports that he has continued to be ambulatory with the numbness sensation.  He denies any known injury to his back in the past.  He states that he volunteered to go home from work today and was told that he would need to obtain a doctor's note stating that he could come back to work.  Denies any incontinence of bowel or bladder or saddle anesthesias.      Physical Exam   Triage Vital Signs: ED Triage Vitals  Enc Vitals Group     BP 11/01/22 1235 (!) 142/102     Pulse Rate 11/01/22 1235 74     Resp 11/01/22 1235 20     Temp 11/01/22 1235 98.6 F (37 C)     Temp Source 11/01/22 1235 Oral     SpO2 11/01/22 1235 99 %     Weight 11/01/22 1234 155 lb (70.3 kg)     Height 11/01/22 1234 5\' 9"  (1.753 m)     Head Circumference --      Peak Flow --      Pain Score 11/01/22 1234 8     Pain Loc --      Pain Edu? --      Excl. in GC? --     Most recent vital signs: Vitals:   11/01/22 1235 11/01/22 1420  BP: (!) 142/102 (!) 131/90  Pulse: 74 80  Resp: 20 20  Temp: 98.6 F (37 C)   SpO2: 99% 99%     General: Awake, no distress.  CV:  Good peripheral perfusion.  Resp:  Normal effort.  Abd:  No distention.  Other:  No gross deformities noted on inspection of the lower lumbar spine.  No point tenderness to spine or step-offs are noted.  Range of motion is slow and guarded.  There is some minimal tenderness right paravertebral muscle however there is increased tenderness on palpation of the right SI joint area.  Straight leg raises are 90 degrees with discomfort in the right leg.   Reflexes are 2+ bilaterally.  Patient is able to stand and ambulate without any assistance.  Good muscle strength at 5/5.   ED Results / Procedures / Treatments   Labs (all labs ordered are listed, but only abnormal results are displayed) Labs Reviewed - No data to display    RADIOLOGY  Lumbar spine x-ray images were reviewed by myself independently and noted degenerative changes lower lumbar region.  Radiology also comments on retrolisthesis L4 on L5 and L5 on S1, mild curvature and disc narrowing.   PROCEDURES:  Critical Care performed:   Procedures   MEDICATIONS ORDERED IN ED: Medications  ketorolac (TORADOL) 30 MG/ML injection 30 mg (30 mg Intramuscular Given 11/01/22 1419)     IMPRESSION / MDM / ASSESSMENT AND PLAN / ED COURSE  I reviewed the triage vital signs and the nursing notes.   Differential diagnosis includes, but is not limited to, right leg radiculopathy, muscle strain, musculoskeletal pain.  46 year old male presents to the ED with complaint of right lower extremity  numbness for approximately 1-1/2 months.  Patient denies any history of injury or previous back problems.  He has not taken any over-the-counter medication.  Physical exam is consistent with sciatica and x-rays show degenerative changes along with disc space narrowing at L4-L5 and L5-S1 area.  Results were discussed with patient.  He was given Toradol 30 mg IM while in the ED and a prescription for prednisone was sent to the pharmacy.  He is to follow-up with Dr. Martha Clan if any continued problems with his back.  We also discussed taking anti-inflammatories with initial symptoms when this occurs next time.      Patient's presentation is most consistent with acute complicated illness / injury requiring diagnostic workup.  FINAL CLINICAL IMPRESSION(S) / ED DIAGNOSES   Final diagnoses:  Sciatica of right side     Rx / DC Orders   ED Discharge Orders          Ordered    predniSONE  (DELTASONE) 10 MG tablet        11/01/22 1417             Note:  This document was prepared using Dragon voice recognition software and may include unintentional dictation errors.   Tommi Rumps, PA-C 11/01/22 1448    Dionne Bucy, MD 11/01/22 1745

## 2022-11-01 NOTE — ED Notes (Signed)
Pt to ED via POV c/o right leg pain. Pt states that this has been going on for a while. Pt is in NAD.

## 2022-11-01 NOTE — ED Triage Notes (Signed)
Pt via POV from home. Pt c/o R sided back pain that radiates down his R leg. Denies hx of sciatica. States that it has been going on for months. Pt is A&OX4 and NAD

## 2022-11-01 NOTE — ED Provider Triage Note (Signed)
Emergency Medicine Provider Triage Evaluation Note  Phillip Fisher, a 46 y.o. male  was evaluated in triage.  Pt complains of right-sided hip pain with referral on the right leg.  He denies a known history of sciatica and denies any recent injury, trauma, falls.  He reports intermittent symptoms for the last month or so.  Review of Systems  Positive: RLE pain/paresthesias Negative: incontinence  Physical Exam  BP (!) 142/102   Pulse 74   Temp 98.6 F (37 C) (Oral)   Resp 20   Ht 5\' 9"  (1.753 m)   Wt 70.3 kg   SpO2 99%   BMI 22.89 kg/m  Gen:   Awake, no distress  NAD Resp:  Normal effort CTA MSK:   Moves extremities without difficulty  Other:    Medical Decision Making  Medically screening exam initiated at 1:12 PM.  Appropriate orders placed.  Phillip Fisher was informed that the remainder of the evaluation will be completed by another provider, this initial triage assessment does not replace that evaluation, and the importance of remaining in the ED until their evaluation is complete.  Patient to the ED for several months of right-sided low back pain and radicular symptoms.   Lucrezia Starch, PA-C 11/01/22 1317

## 2023-09-11 ENCOUNTER — Other Ambulatory Visit: Payer: Self-pay

## 2023-09-11 ENCOUNTER — Emergency Department
Admission: EM | Admit: 2023-09-11 | Discharge: 2023-09-11 | Disposition: A | Discharge status: Home / Self Care | Payer: Self-pay | Attending: Emergency Medicine | Admitting: Emergency Medicine

## 2023-09-11 ENCOUNTER — Emergency Department: Payer: Self-pay

## 2023-09-11 DIAGNOSIS — X500XXA Overexertion from strenuous movement or load, initial encounter: Secondary | ICD-10-CM | POA: Diagnosis not present

## 2023-09-11 DIAGNOSIS — D72829 Elevated white blood cell count, unspecified: Secondary | ICD-10-CM | POA: Insufficient documentation

## 2023-09-11 DIAGNOSIS — M4807 Spinal stenosis, lumbosacral region: Secondary | ICD-10-CM | POA: Diagnosis not present

## 2023-09-11 DIAGNOSIS — S3992XA Unspecified injury of lower back, initial encounter: Secondary | ICD-10-CM | POA: Diagnosis not present

## 2023-09-11 DIAGNOSIS — M438X5 Other specified deforming dorsopathies, thoracolumbar region: Secondary | ICD-10-CM | POA: Diagnosis not present

## 2023-09-11 DIAGNOSIS — S39012A Strain of muscle, fascia and tendon of lower back, initial encounter: Secondary | ICD-10-CM | POA: Insufficient documentation

## 2023-09-11 DIAGNOSIS — M5136 Other intervertebral disc degeneration, lumbar region with discogenic back pain only: Secondary | ICD-10-CM | POA: Diagnosis not present

## 2023-09-11 DIAGNOSIS — M4316 Spondylolisthesis, lumbar region: Secondary | ICD-10-CM | POA: Diagnosis not present

## 2023-09-11 LAB — COMPREHENSIVE METABOLIC PANEL
ALT: 13 U/L (ref 0–44)
AST: 16 U/L (ref 15–41)
Albumin: 3.4 g/dL — ABNORMAL LOW (ref 3.5–5.0)
Alkaline Phosphatase: 70 U/L (ref 38–126)
Anion gap: 11 (ref 5–15)
BUN: 6 mg/dL (ref 6–20)
CO2: 23 mmol/L (ref 22–32)
Calcium: 8.7 mg/dL — ABNORMAL LOW (ref 8.9–10.3)
Chloride: 98 mmol/L (ref 98–111)
Creatinine, Ser: 0.93 mg/dL (ref 0.61–1.24)
GFR, Estimated: >60 mL/min (ref >60)
Glucose, Bld: 103 mg/dL — ABNORMAL HIGH (ref 70–99)
Potassium: 3.8 mmol/L (ref 3.5–5.1)
Sodium: 132 mmol/L — ABNORMAL LOW (ref 135–145)
Total Bilirubin: 0.4 mg/dL (ref 0.3–1.2)
Total Protein: 7.2 g/dL (ref 6.5–8.1)

## 2023-09-11 LAB — URINALYSIS, ROUTINE W REFLEX MICROSCOPIC
Bilirubin Urine: NEGATIVE
Glucose, UA: NEGATIVE mg/dL
Hgb urine dipstick: NEGATIVE
Ketones, ur: 5 mg/dL — AB
Leukocytes,Ua: NEGATIVE
Nitrite: NEGATIVE
Protein, ur: NEGATIVE mg/dL
Specific Gravity, Urine: 1.024 (ref 1.005–1.030)
pH: 5 (ref 5.0–8.0)

## 2023-09-11 LAB — CBC
HCT: 48.8 % (ref 39.0–52.0)
Hemoglobin: 17 g/dL (ref 13.0–17.0)
MCH: 33.9 pg (ref 26.0–34.0)
MCHC: 34.8 g/dL (ref 30.0–36.0)
MCV: 97.2 fL (ref 80.0–100.0)
Platelets: 318 10*3/uL (ref 150–400)
RBC: 5.02 MIL/uL (ref 4.22–5.81)
RDW: 12.5 % (ref 11.5–15.5)
WBC: 13.3 10*3/uL — ABNORMAL HIGH (ref 4.0–10.5)
nRBC: 0 % (ref 0.0–0.2)

## 2023-09-11 LAB — LIPASE, BLOOD: Lipase: 50 U/L (ref 11–51)

## 2023-09-11 MED ORDER — CYCLOBENZAPRINE HCL 5 MG PO TABS: 5.0000 mg | ORAL_TABLET | Freq: Three times a day (TID) | ORAL | 0 refills | Status: AC | PRN

## 2023-09-11 MED ORDER — NAPROXEN 500 MG PO TABS: 500.0000 mg | ORAL_TABLET | Freq: Two times a day (BID) | ORAL | 2 refills | Status: AC

## 2023-09-11 MED ORDER — KETOROLAC TROMETHAMINE 30 MG/ML IJ SOLN
30.0000 mg | Freq: Once | INTRAMUSCULAR | Status: AC
  Administered 2023-09-11: 30 mg via INTRAMUSCULAR
  Filled 2023-09-11: qty 1

## 2023-09-11 NOTE — ED Notes (Signed)
Pt given urine cup. Unable to urinate at this time. Instructed to bring it back to desk when done if before called to a room.

## 2023-09-11 NOTE — ED Provider Notes (Signed)
[facility] Provider Note    Event Date/Time   First MD Initiated Contact with Patient 09/11/23 1543     (approximate)   History   Back pain   HPI  Phillip Fisher is a 47 y.o. male with a history of sciatica of the right side presents with complaints of right lower back pain.  Patient reports he was mowing the lawn, was pulling a lawnmower out of a ditch and felt a pain in his low back on the right side.  Denies numbness or weakness.  No saddle anesthesia.  No loss of urinary continence.  Additionally he is also been having loose stools over the last several days but does not think this is related.    Physical Exam   Triage Vital Signs: ED Triage Vitals  Encounter Vitals Group     BP 09/11/23 1521 117/82     Systolic BP Percentile --      Diastolic BP Percentile --      Pulse Rate 09/11/23 1521 100     Resp 09/11/23 1521 14     Temp 09/11/23 1521 98.5 F (36.9 C)     Temp Source 09/11/23 1521 Oral     SpO2 09/11/23 1521 99 %     Weight 09/11/23 1519 68 kg (150 lb)     Height 09/11/23 1519 1.753 m (5\' 9" )     Head Circumference --      Peak Flow --      Pain Score 09/11/23 1518 8     Pain Loc --      Pain Education --      Exclude from Growth Chart --     Most recent vital signs: Vitals:   09/11/23 1521 09/11/23 1741  BP: 117/82 122/88  Pulse: 100 79  Resp: 14 16  Temp: 98.5 F (36.9 C) 98.2 F (36.8 C)  SpO2: 99% 99%     General: Awake, no distress.  CV:  Good peripheral perfusion.  Resp:  Normal effort.  Abd:  No distention.  Soft, nontender, reassuring exam Other:  Back: No vertebral tenderness palpation, point tenderness right lumbar paraspinal region, ambulates well, normal strength in the lower extremities, normal sensation.   ED Results / Procedures / Treatments   Labs (all labs ordered are listed, but only abnormal results are displayed) Labs Reviewed  COMPREHENSIVE METABOLIC PANEL - Abnormal; Notable for the  following components:      Result Value   Sodium 132 (*)    Glucose, Bld 103 (*)    Calcium 8.7 (*)    Albumin 3.4 (*)    All other components within normal limits  CBC - Abnormal; Notable for the following components:   WBC 13.3 (*)    All other components within normal limits  URINALYSIS, ROUTINE W REFLEX MICROSCOPIC - Abnormal; Notable for the following components:   Color, Urine AMBER (*)    APPearance HAZY (*)    Ketones, ur 5 (*)    All other components within normal limits  LIPASE, BLOOD     EKG     RADIOLOGY Lumbar x-rays viewed interpret by me, no fracture    PROCEDURES:  Critical Care performed:   Procedures   MEDICATIONS ORDERED IN ED: Medications  ketorolac (TORADOL) 30 MG/ML injection 30 mg (30 mg Intramuscular Given 09/11/23 1607)     IMPRESSION / MDM / ASSESSMENT AND PLAN / ED COURSE  I reviewed the triage vital signs and the nursing notes. Patient's  presentation is most consistent with acute complicated illness / injury requiring diagnostic workup.  Patient presents with right lower back pain as detailed above, suspicious for musculoskeletal injury.  Lab work is generally reassuring, mild elevation in white blood cell count is nonspecific, afebrile, no IV drug abuse, not consistent with infection.  Will treat with IM Toradol, consider muscle relaxers, pending radiology        FINAL CLINICAL IMPRESSION(S) / ED DIAGNOSES   Final diagnoses:  Strain of lumbar region, initial encounter     Rx / DC Orders   ED Discharge Orders          Ordered    cyclobenzaprine (FLEXERIL) 5 MG tablet  3 times daily PRN        09/11/23 1734    naproxen (NAPROSYN) 500 MG tablet  2 times daily with meals        09/11/23 1734             Note:  This document was prepared using Dragon voice recognition software and may include unintentional dictation errors.   Jene Every, MD 09/11/23 1910

## 2023-09-11 NOTE — ED Triage Notes (Signed)
Pt to ed from home via POV for lower back pain and abdominal pain.   Pt states " Saturday I was mowing the gras and I felt something pop in my back. I have pain in my lower back and some abd pain and emesis since Saturday". Pt is CAOx4, in no acute distress and ambulatory in triage.

## 2023-09-11 NOTE — ED Notes (Signed)
Patient provided crackers and PB
